# Patient Record
Sex: Male | Born: 1950 | Race: White | Hispanic: No
Health system: Midwestern US, Academic
[De-identification: ages and names within clinical notes are randomized; demographics above are authoritative.]

---

## 2017-02-08 ENCOUNTER — Encounter: Admit: 2017-02-08 | Discharge: 2017-02-08 | Payer: MEDICARE

## 2017-02-08 MED ORDER — LOSARTAN 50 MG PO TAB
50 mg | ORAL_TABLET | Freq: Two times a day (BID) | ORAL | 2 refills | 30.00000 days | Status: AC
Start: 2017-02-08 — End: 2019-04-08

## 2017-02-08 MED ORDER — ATORVASTATIN 80 MG PO TAB
80 mg | ORAL_TABLET | Freq: Every day | ORAL | 2 refills | Status: AC
Start: 2017-02-08 — End: 2018-02-14

## 2017-02-08 MED ORDER — AMLODIPINE 5 MG PO TAB
5 mg | ORAL_TABLET | Freq: Two times a day (BID) | ORAL | 2 refills | Status: AC
Start: 2017-02-08 — End: 2017-11-12

## 2017-02-15 LAB — COMPREHENSIVE METABOLIC PANEL
Lab: 24 — ABNORMAL HIGH (ref 80.0–99.0)
Lab: 5.1

## 2017-02-15 LAB — CBC
Lab: 3.8 — ABNORMAL LOW (ref 4.70–6.10)
Lab: 42
Lab: 5.2

## 2017-08-27 ENCOUNTER — Encounter: Admit: 2017-08-27 | Discharge: 2017-08-27 | Payer: MEDICARE

## 2017-11-12 ENCOUNTER — Encounter: Admit: 2017-11-12 | Discharge: 2017-11-12 | Payer: MEDICARE

## 2017-11-12 MED ORDER — AMLODIPINE 5 MG PO TAB
5 mg | ORAL_TABLET | Freq: Two times a day (BID) | ORAL | 0 refills | Status: AC
Start: 2017-11-12 — End: 2019-01-06

## 2017-11-26 ENCOUNTER — Encounter: Admit: 2017-11-26 | Discharge: 2017-11-26 | Payer: MEDICARE

## 2017-11-26 MED ORDER — FOLIC ACID 1 MG PO TAB
1 mg | ORAL_TABLET | Freq: Every day | ORAL | 3 refills | Status: AC
Start: 2017-11-26 — End: 2017-12-20

## 2017-12-07 ENCOUNTER — Encounter: Admit: 2017-12-07 | Discharge: 2017-12-07 | Payer: MEDICARE

## 2017-12-07 DIAGNOSIS — E785 Hyperlipidemia, unspecified: Principal | ICD-10-CM

## 2017-12-14 ENCOUNTER — Encounter: Admit: 2017-12-14 | Discharge: 2017-12-14 | Payer: MEDICARE

## 2017-12-14 DIAGNOSIS — E785 Hyperlipidemia, unspecified: Principal | ICD-10-CM

## 2017-12-14 LAB — LIPID PROFILE
Lab: 192
Lab: 67
Lab: 74
Lab: 101 — ABNORMAL HIGH (ref 35–60)
Lab: 15
Lab: 2

## 2017-12-20 ENCOUNTER — Encounter: Admit: 2017-12-20 | Discharge: 2017-12-20 | Payer: MEDICARE

## 2017-12-20 ENCOUNTER — Ambulatory Visit: Admit: 2017-12-20 | Discharge: 2017-12-21 | Payer: MEDICARE

## 2017-12-20 DIAGNOSIS — I214 Non-ST elevation (NSTEMI) myocardial infarction: ICD-10-CM

## 2017-12-20 DIAGNOSIS — I2510 Atherosclerotic heart disease of native coronary artery without angina pectoris: ICD-10-CM

## 2017-12-20 DIAGNOSIS — E785 Hyperlipidemia, unspecified: ICD-10-CM

## 2017-12-20 DIAGNOSIS — R05 Cough: ICD-10-CM

## 2017-12-20 DIAGNOSIS — I452 Bifascicular block: ICD-10-CM

## 2017-12-20 DIAGNOSIS — R9431 Abnormal electrocardiogram [ECG] [EKG]: ICD-10-CM

## 2017-12-20 DIAGNOSIS — F1010 Alcohol abuse, uncomplicated: ICD-10-CM

## 2017-12-20 DIAGNOSIS — I10 Essential (primary) hypertension: Principal | ICD-10-CM

## 2017-12-20 DIAGNOSIS — E7800 Pure hypercholesterolemia, unspecified: Principal | ICD-10-CM

## 2017-12-20 DIAGNOSIS — Z720 Tobacco use: ICD-10-CM

## 2017-12-20 MED ORDER — FOLIC ACID 1 MG PO TAB
1 mg | ORAL_TABLET | Freq: Every day | ORAL | 3 refills | Status: AC
Start: 2017-12-20 — End: 2019-04-08

## 2018-02-09 ENCOUNTER — Encounter: Admit: 2018-02-09 | Discharge: 2018-02-09 | Payer: MEDICARE

## 2018-02-11 MED ORDER — AMLODIPINE 5 MG PO TAB
5 mg | ORAL_TABLET | Freq: Two times a day (BID) | ORAL | 2 refills | Status: AC
Start: 2018-02-11 — End: 2018-11-11

## 2018-02-11 MED ORDER — LOSARTAN 50 MG PO TAB
ORAL_TABLET | Freq: Two times a day (BID) | ORAL | 3 refills | 90.00000 days | Status: AC
Start: 2018-02-11 — End: 2019-01-06

## 2018-02-14 ENCOUNTER — Encounter: Admit: 2018-02-14 | Discharge: 2018-02-14 | Payer: MEDICARE

## 2018-02-14 MED ORDER — ATORVASTATIN 80 MG PO TAB
80 mg | ORAL_TABLET | Freq: Every day | ORAL | 3 refills | Status: AC
Start: 2018-02-14 — End: 2019-02-18

## 2018-06-13 ENCOUNTER — Encounter: Admit: 2018-06-13 | Discharge: 2018-06-13 | Payer: MEDICARE

## 2018-06-19 ENCOUNTER — Ambulatory Visit: Admit: 2018-06-19 | Discharge: 2018-06-20 | Payer: MEDICARE

## 2018-06-19 ENCOUNTER — Ambulatory Visit: Admit: 2018-06-19 | Discharge: 2018-06-19 | Payer: MEDICARE

## 2018-06-19 DIAGNOSIS — E785 Hyperlipidemia, unspecified: ICD-10-CM

## 2018-06-19 DIAGNOSIS — E7800 Pure hypercholesterolemia, unspecified: Principal | ICD-10-CM

## 2018-06-19 DIAGNOSIS — F1010 Alcohol abuse, uncomplicated: ICD-10-CM

## 2018-06-19 MED ORDER — SODIUM CHLORIDE 0.9 % IV SOLP
250 mL | INTRAVENOUS | 0 refills | Status: AC | PRN
Start: 2018-06-19 — End: ?

## 2018-06-19 MED ORDER — ALBUTEROL SULFATE 90 MCG/ACTUATION IN HFAA
2 | RESPIRATORY_TRACT | 0 refills | Status: DC | PRN
Start: 2018-06-19 — End: 2018-06-24

## 2018-06-19 MED ORDER — PERFLUTREN LIPID MICROSPHERES 1.1 MG/ML IV SUSP
1-20 mL | Freq: Once | INTRAVENOUS | 0 refills | Status: CP | PRN
Start: 2018-06-19 — End: ?

## 2018-06-19 MED ORDER — NITROGLYCERIN 0.4 MG SL SUBL
.4 mg | SUBLINGUAL | 0 refills | Status: AC | PRN
Start: 2018-06-19 — End: ?

## 2018-06-19 MED ORDER — BENZOCAINE-MENTHOL 6-10 MG MM LOZG
1 | Freq: Once | ORAL | 0 refills | Status: AC | PRN
Start: 2018-06-19 — End: ?

## 2018-06-19 MED ORDER — AMINOPHYLLINE 500 MG/20 ML IV SOLN
50 mg | INTRAVENOUS | 0 refills | Status: AC | PRN
Start: 2018-06-19 — End: ?

## 2018-06-19 MED ORDER — REGADENOSON 0.4 MG/5 ML IV SYRG
.4 mg | Freq: Once | INTRAVENOUS | 0 refills | Status: CP
Start: 2018-06-19 — End: ?

## 2018-06-24 ENCOUNTER — Encounter: Admit: 2018-06-24 | Discharge: 2018-06-24 | Payer: MEDICARE

## 2018-11-11 ENCOUNTER — Encounter: Admit: 2018-11-11 | Discharge: 2018-11-11 | Payer: MEDICARE

## 2018-11-11 MED ORDER — AMLODIPINE 5 MG PO TAB
5 mg | ORAL_TABLET | Freq: Two times a day (BID) | ORAL | 0 refills | Status: AC
Start: 2018-11-11 — End: 2019-04-08

## 2019-01-06 ENCOUNTER — Encounter: Admit: 2019-01-06 | Discharge: 2019-01-06 | Payer: MEDICARE

## 2019-02-15 ENCOUNTER — Encounter: Admit: 2019-02-15 | Discharge: 2019-02-15

## 2019-02-15 DIAGNOSIS — Z5181 Encounter for therapeutic drug level monitoring: Secondary | ICD-10-CM

## 2019-02-15 DIAGNOSIS — E785 Hyperlipidemia, unspecified: Secondary | ICD-10-CM

## 2019-02-18 MED ORDER — ATORVASTATIN 80 MG PO TAB
ORAL_TABLET | Freq: Every day | 0 refills | Status: DC
Start: 2019-02-18 — End: 2019-05-09

## 2019-02-18 NOTE — Telephone Encounter
Pt due for OV and lab.  OV scheduled and lab mailed to pt.  He will have those drawn at his earliest convenience.  Limited script filled.

## 2019-04-01 LAB — ALT (SGPT): Lab: 44

## 2019-04-01 LAB — LIPID PROFILE
Lab: 10
Lab: 153
Lab: 2
Lab: 51
Lab: 68

## 2019-04-01 LAB — AST (SGOT): Lab: 46 — ABNORMAL HIGH (ref 5–34)

## 2019-04-07 ENCOUNTER — Encounter: Admit: 2019-04-07 | Discharge: 2019-04-07

## 2019-04-07 DIAGNOSIS — Z5181 Encounter for therapeutic drug level monitoring: Secondary | ICD-10-CM

## 2019-04-07 DIAGNOSIS — E785 Hyperlipidemia, unspecified: Secondary | ICD-10-CM

## 2019-04-08 ENCOUNTER — Encounter: Admit: 2019-04-08 | Discharge: 2019-04-08

## 2019-04-08 ENCOUNTER — Ambulatory Visit: Admit: 2019-04-08 | Discharge: 2019-04-09

## 2019-04-08 DIAGNOSIS — Z720 Tobacco use: Secondary | ICD-10-CM

## 2019-04-08 DIAGNOSIS — I214 Non-ST elevation (NSTEMI) myocardial infarction: Secondary | ICD-10-CM

## 2019-04-08 DIAGNOSIS — I10 Essential (primary) hypertension: Secondary | ICD-10-CM

## 2019-04-08 DIAGNOSIS — I2510 Atherosclerotic heart disease of native coronary artery without angina pectoris: Secondary | ICD-10-CM

## 2019-04-08 DIAGNOSIS — R05 Cough: Secondary | ICD-10-CM

## 2019-04-08 DIAGNOSIS — R9431 Abnormal electrocardiogram [ECG] [EKG]: Secondary | ICD-10-CM

## 2019-04-08 DIAGNOSIS — I452 Bifascicular block: Secondary | ICD-10-CM

## 2019-04-08 MED ORDER — LOSARTAN 50 MG PO TAB
50 mg | ORAL_TABLET | Freq: Two times a day (BID) | ORAL | 2 refills | 30.00000 days | Status: AC
Start: 2019-04-08 — End: ?

## 2019-04-08 MED ORDER — LOSARTAN 50 MG PO TAB
50 mg | ORAL_TABLET | Freq: Two times a day (BID) | ORAL | 2 refills | 30.00000 days | Status: DC
Start: 2019-04-08 — End: 2019-04-08

## 2019-04-08 MED ORDER — METOPROLOL TARTRATE 25 MG PO TAB
25 mg | ORAL_TABLET | Freq: Two times a day (BID) | ORAL | 3 refills | 90.00000 days | Status: AC
Start: 2019-04-08 — End: ?

## 2019-04-08 NOTE — Progress Notes
Date of Service: 04/08/2019    Scott Cervantes is a 68 y.o. male.       HPI     Patient is a 68 year old white male.  He does have a history of CAD, patient sustained a non-ST MI in March 2014, at that time he was admitted at [facility], he underwent a left heart catheterization, this resulted in PCI of the LAD.    Patient remains asymptomatic from a CAD standpoint, he has not had any recurrent symptoms of angina has not taken sublingual nitroglycerin.    The most recent ischemic work-up was performed in October 2019, at that time a regadenoson MPI was mildly abnormal but overall low risk, it demonstrated a small size mild intensity predominantly fixed defect of the apical inferior wall, mild hypokinesis of the LV septum was also present, overall ejection fraction was normal at 78%.  A 2D echo cardiogram dated 06/19/2018 was within normal limits, the sinuses of Valsalva were at the upper limits of normal they measured 4.0 cm (normal range: 2.82-4.0 cm).    Patient unfortunately continues to smoke a pack and a half of cigarettes a day.    His blood pressure is suboptimally controlled today.         Vitals:    04/08/19 1236 04/08/19 1246   BP: (!) 160/80 (!) 156/82   BP Source: Arm, Left Upper Arm, Right Upper   Pulse: 96    Temp: 37 ???C (98.6 ???F)    SpO2: 97%    Weight: 73.7 kg (162 lb 6.4 oz)    Height: 1.753 m (5' 9)    PainSc: Zero      Body mass index is 23.98 kg/m???.     Past Medical History  Patient Active Problem List    Diagnosis Date Noted   ??? 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 Essential hypertension 04/15/2015   ??? Abnormal EKG 07/09/2014   ??? 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 CAD (coronary artery disease) 11/08/2012     11/07/12: NSTEMI/Cath at [facility] - Single-vessel CAD manifested by ostial/proximal moderate stenosis of 70% in the LAD.  This was fractional flow reserve positive with a FFR ratio of 0.61 within 30 seconds of Adenosine infusion. S/P PCI with 3.5 x 23 Xience Xpedition stent in the ostium of the LAD        ??? Dyslipidemia 11/08/2012   ??? NSTEMI (non-ST elevated myocardial infarction) (HCC) 11/07/2012   ??? Hypertension 11/07/2012   ??? ETOH abuse 11/07/2012   ??? Tobacco abuse 11/07/2012         Review of Systems   Constitution: Negative.   HENT: Positive for hearing loss.    Eyes: Negative.    Cardiovascular: Positive for dyspnea on exertion.   Respiratory: Negative.    Endocrine: Negative.    Hematologic/Lymphatic: Negative.    Skin: Negative.    Musculoskeletal: Positive for arthritis and joint pain.   Gastrointestinal: Negative.    Genitourinary: Negative.    Neurological: Negative.    Psychiatric/Behavioral: Negative.    Allergic/Immunologic: Negative.        Physical Exam  General Appearance: normal in appearance  Skin: warm, moist, no ulcers or xanthomas  Eyes: conjunctivae and lids normal, pupils are equal and round  Lips & Oral Mucosa: no pallor or cyanosis  Neck Veins: neck veins are flat, neck veins are not distended  Chest Inspection: chest is normal in appearance  Respiratory  Effort: breathing comfortably, no respiratory distress  Auscultation/Percussion: lungs clear to auscultation, no rales or rhonchi, no wheezing  Cardiac Rhythm: regular rhythm and normal rate  Cardiac Auscultation: S1, S2 normal, no rub, no gallop  Murmurs: no murmur  Carotid Arteries: normal carotid upstroke bilaterally, no bruit  Lower Extremity Edema: no lower extremity edema  Abdominal Exam: soft, non-tender, no masses, bowel sounds normal  Liver & Spleen: no organomegaly  Language and Memory: patient responsive and seems to comprehend information  Neurologic Exam: neurological assessment grossly intact      Cardiovascular Studies  Twelve-lead EKG demonstrates normal sinus rhythm, right bundle branch block, left axis deviation, left anterior fascicular block    Problems Addressed Today  Encounter Diagnoses   Name Primary? ??? Coronary artery disease involving native coronary artery of native heart without angina pectoris Yes   ??? Essential hypertension    ??? RBBB (right bundle branch block with left anterior fascicular block)    ??? NSTEMI (non-ST elevated myocardial infarction) (HCC)    ??? Tobacco abuse    ??? Cough secondary to angiotensin converting enzyme inhibitor (ACE-I)    ??? Abnormal EKG        Assessment and Plan     In summary: This is a 68 year old white male with a history of obstructive CAD, patient did sustain a non-ST MI, he underwent PCI of the LAD in March 2014, patient has not had any recurrent symptoms of angina and has not taken sublingual nitroglycerin.    The most recent ischemic work-up was performed in October 2019, a perfusion imaging study completed at that time did not demonstrate any large areas of jeopardized ischemic myocardium, the echocardiogram was overall unremarkable.    Today, patient's blood pressure is suboptimally controlled.    Plan:    1.  I did ask Mr. Scott Cervantes to take the losartan 50 mg p.o. every morning and take the Norvasc 5 mg p.o. every afternoon and continue to monitor his blood pressure  2.  I also recommended a low-salt diet and tobacco cessation  3.  Follow-up office visit in approximately 6           Current Medications (including today's revisions)  ??? amLODIPine (NORVASC) 5 mg tablet Take 1 tablet by mouth twice daily (Patient taking differently: Take 5 mg by mouth daily.)   ??? aspirin 81 mg chewable tablet Take 81 mg by mouth daily.   ??? atorvastatin (LIPITOR) 80 mg tablet Take 1 tablet by mouth once daily   ??? losartan (COZAAR) 50 mg tablet Take 1 tablet by mouth twice daily. (Patient taking differently: Take 50 mg by mouth daily.)   ??? nitroglycerin (NITROSTAT) 0.4 mg tablet Place 1 Tab under tongue every 5 minutes as needed for Chest Pain.   ??? thiamine (VITAMIN B-1) 100 mg tablet Take 1 Tab by mouth daily.

## 2019-04-24 ENCOUNTER — Encounter: Admit: 2019-04-24 | Discharge: 2019-04-24

## 2019-05-09 ENCOUNTER — Encounter: Admit: 2019-05-09 | Discharge: 2019-05-09

## 2019-05-09 MED ORDER — ATORVASTATIN 80 MG PO TAB
ORAL_TABLET | Freq: Every day | 3 refills | Status: AC
Start: 2019-05-09 — End: ?

## 2020-01-30 IMAGING — CT CHEST W(Adult)
2 of 5 series · 12 of 36 positions shown, 15 images · IV contrast (Omnipaque)
Comparison: none

[Series 4: thorax cor 5.00 br40 s3 · coronal · 0.74mm/px · 3 of 55 slices shown]
[im 11/55  lung]
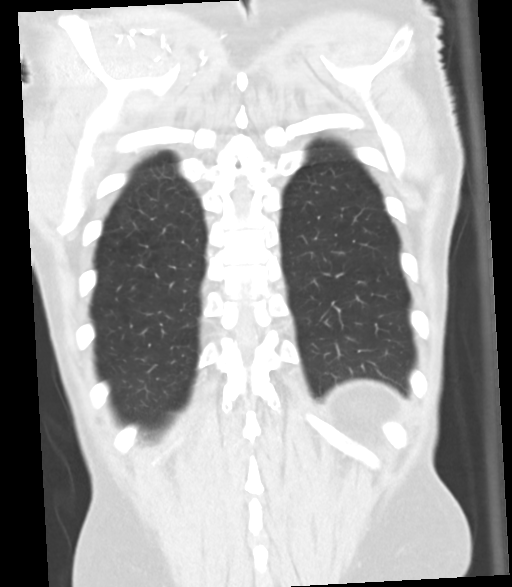
[im 22/55  lung]
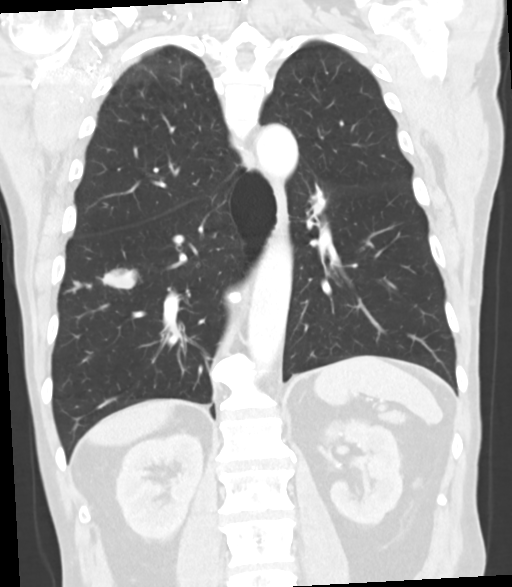
[im 33/55  lung]
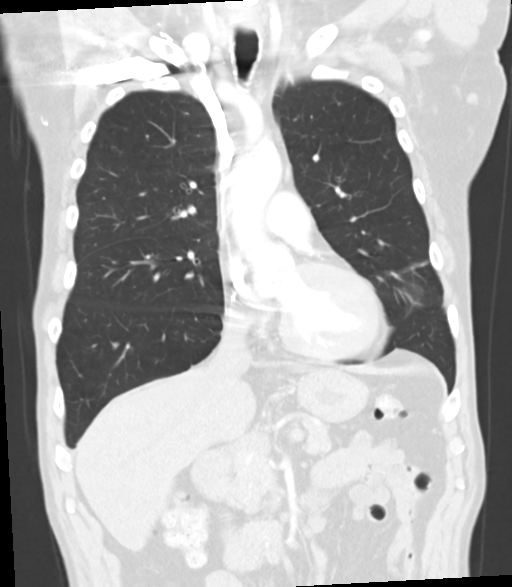

[Series 8: thorax ax 5.00 br60 s3 · axial · 0.58mm/px · z∈[+1579,+1938]mm · 9 of 86 slices shown, 12 images]
[im 7/86  mediastinal]
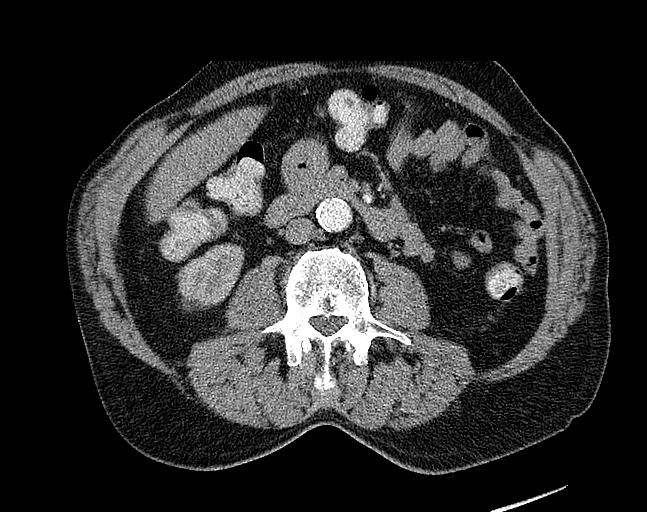
[im 7/86  lung]
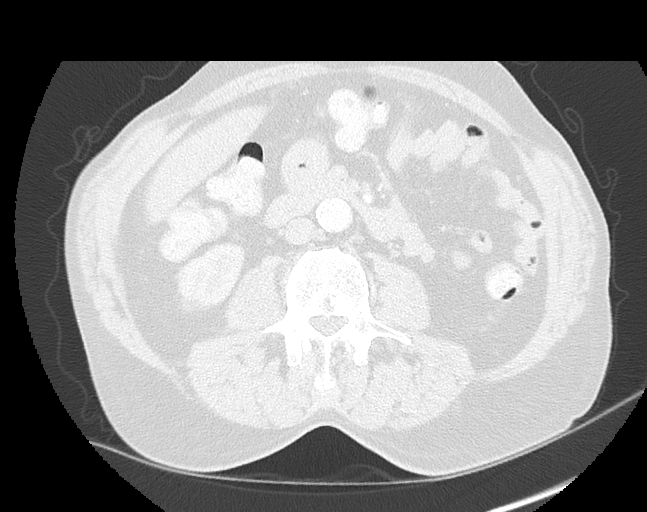
[im 20/86  lung]
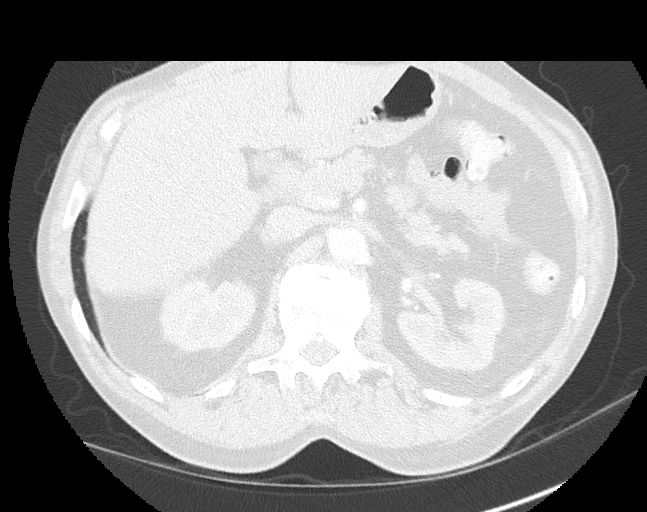
[im 27/86  lung]
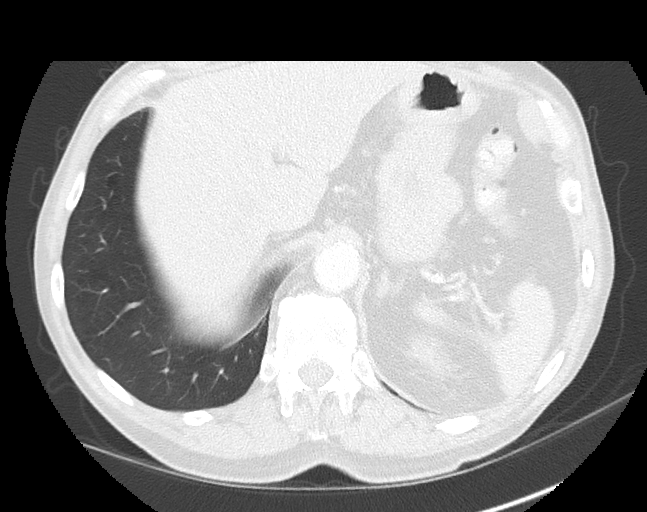
[im 33/86  lung]
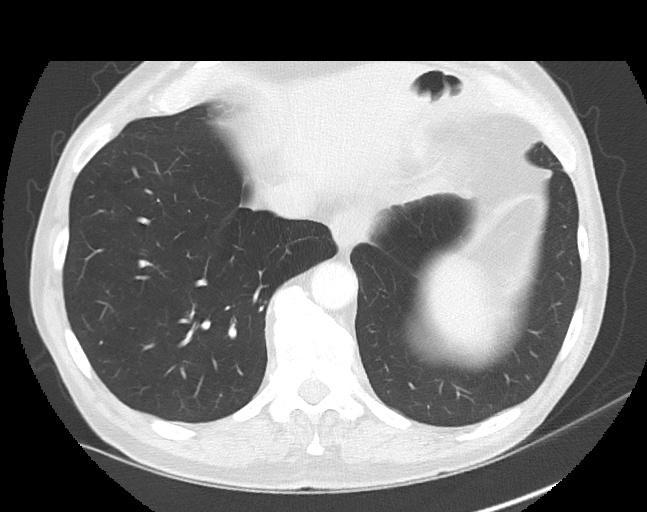
[im 46/86  mediastinal]
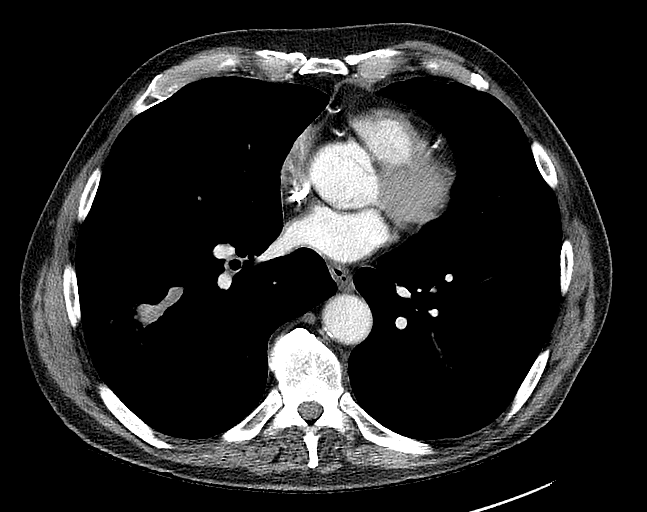
[im 46/86  lung]
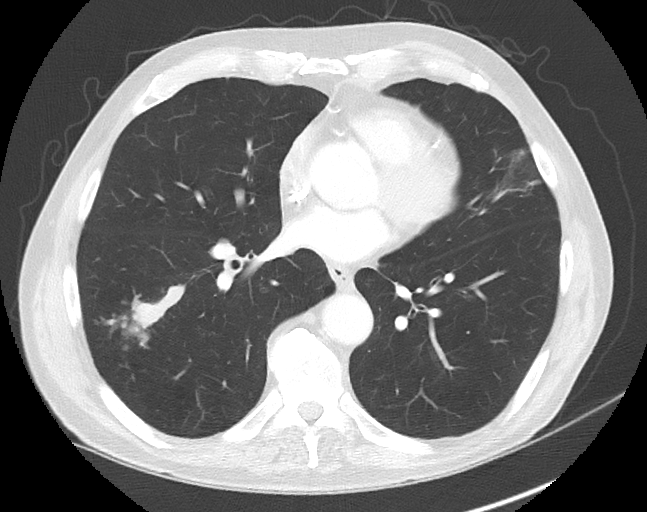
[im 53/86  lung]
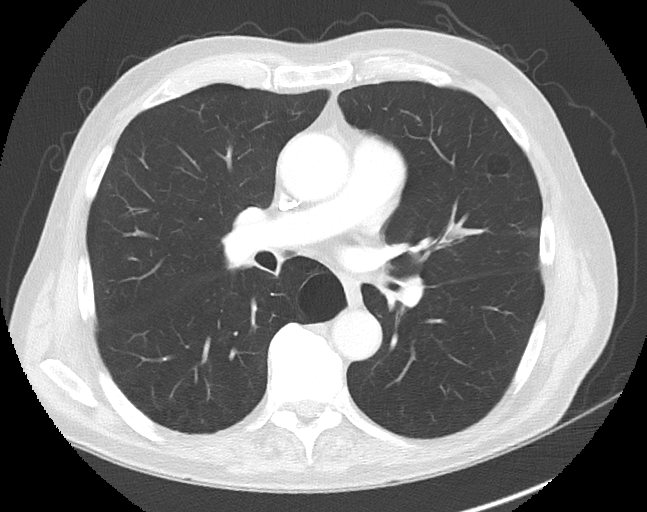
[im 59/86  lung]
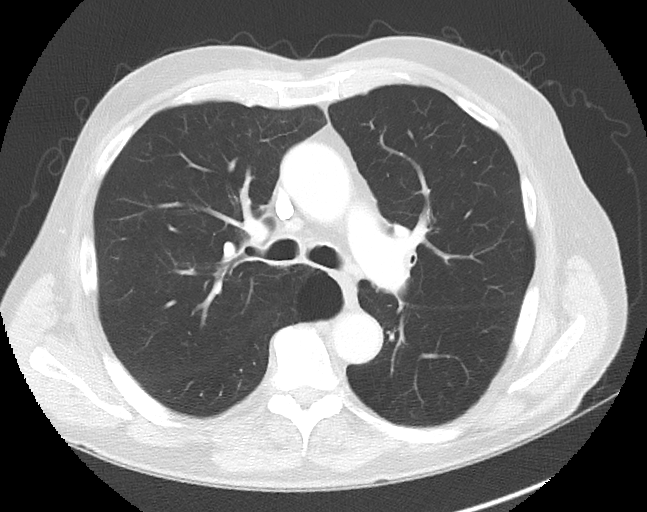
[im 72/86  lung]
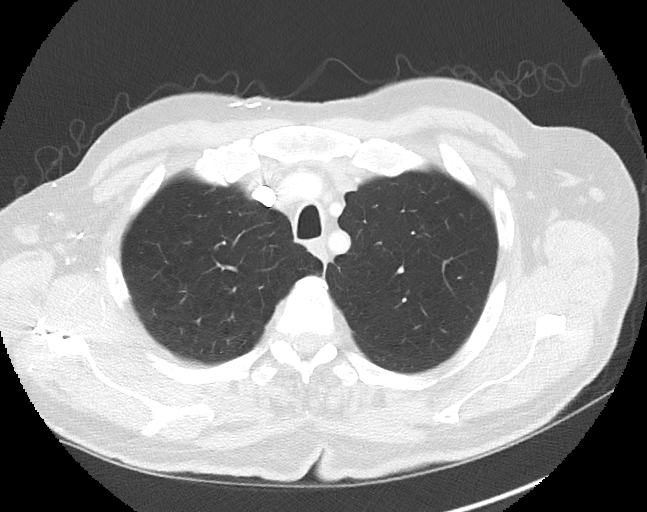
[im 79/86  mediastinal]
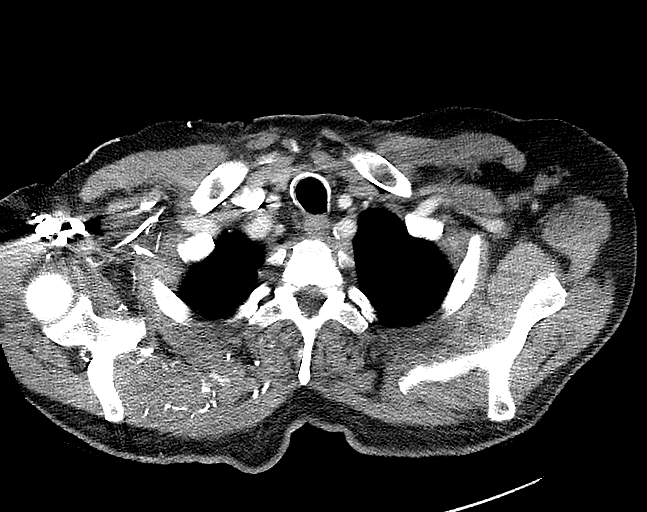
[im 79/86  lung]
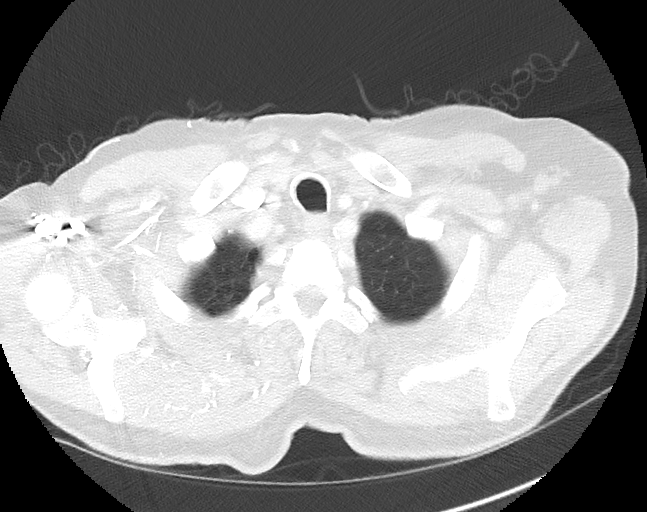

[12 of 36 positions shown; findings below may reference images not displayed]

DIAGNOSTIC STUDIES

EXAM

CT chest w con

INDICATION

Lung Mass
PT C/O COUGH,SOA. POSSIBLE LUNG MASS SEEN ON PREVIOUS CXR. HX OF CAD, SMOKER, HTN. GFR 71. 822T2
PEJ9NAA. CT/NM 0/0. TJ

TECHNIQUE

All CT scans at this facility use dose modulation, iterative reconstruction, and/or weight based
dosing when appropriate to reduce radiation dose to as low as reasonably achievable.

Number of previous computed tomography exams in the last 12 months is 0  .

Number of previous nuclear medicine myocardial perfusion studies in the last 12 months is 0  .

COMPARISONS

None available.

FINDINGS

Heart size is normal. The ascending thoracic aorta measures up to 3.9 centimeters at the level of
the pulmonary artery. Right hilar node measuring 1.2 centimeters in short axis on image 31 and
additional right hilar node measuring 1.2 centimeters in short axis on image 39. No enlarged
mediastinal nodes by size criteria.

There are opacities in the superior lateral aspect of the right lower lobe measuring up to 6
centimeters in length and approximately 3 centimeters in width. The opacities are in a ?finger in
glove?  configuration. There is a thin-walled cyst in the medial right lower lobe. Mild apical
predominant centrilobular emphysema.

There are no acute findings in the upper abdomen. No destructive osseous lesions.

IMPRESSION

Opacities in the right lower lobe are in a configuration consistent with mucoid
impaction/bronchocele. This can be caused by a variety of a benign and malignant etiologies
including ABPA, benign mass such as lipoma or hamartoma, atresia, bronchial carcinoid, bronchogenic
carcinoma, including other etiologies. In addition, there are mildly enlarged right hilar nodes.
Recommend pulmonology referral as the next step in evaluation.

Mild emphysema.

Tech Notes:

PT C/O COUGH,SOA. POSSIBLE LUNG MASS SEEN ON PREVIOUS CXR. HX OF CAD, SMOKER, HTN. GFR 71. 822T2
PEJ9NAA. CT/NM 0/0. TJ
